# Patient Record
Sex: Female | Born: 1955
Health system: Southern US, Community
[De-identification: ages and names within clinical notes are randomized; demographics above are authoritative.]

## PROBLEM LIST (undated history)

## (undated) DIAGNOSIS — E119 Type 2 diabetes mellitus without complications: Secondary | ICD-10-CM

---

## 1998-05-31 ENCOUNTER — Emergency Department (HOSPITAL_COMMUNITY): Admission: EM | Admit: 1998-05-31 | Discharge: 1998-05-31 | Payer: Self-pay | Admitting: Emergency Medicine

## 1998-07-08 ENCOUNTER — Ambulatory Visit (HOSPITAL_COMMUNITY): Admission: RE | Admit: 1998-07-08 | Discharge: 1998-07-08 | Payer: Self-pay | Admitting: Obstetrics

## 1998-07-08 ENCOUNTER — Other Ambulatory Visit: Admission: RE | Admit: 1998-07-08 | Discharge: 1998-07-08 | Payer: Self-pay | Admitting: Obstetrics

## 1998-07-08 ENCOUNTER — Encounter: Admission: RE | Admit: 1998-07-08 | Discharge: 1998-07-08 | Payer: Self-pay | Admitting: Obstetrics

## 1998-07-11 ENCOUNTER — Ambulatory Visit (HOSPITAL_COMMUNITY): Admission: RE | Admit: 1998-07-11 | Discharge: 1998-07-11 | Payer: Self-pay | Admitting: Obstetrics

## 1998-07-11 ENCOUNTER — Encounter: Payer: Self-pay | Admitting: Obstetrics

## 2002-07-12 ENCOUNTER — Other Ambulatory Visit: Admission: RE | Admit: 2002-07-12 | Discharge: 2002-07-12 | Payer: Self-pay | Admitting: Gynecology

## 2002-11-26 ENCOUNTER — Ambulatory Visit (HOSPITAL_COMMUNITY): Admission: RE | Admit: 2002-11-26 | Discharge: 2002-11-26 | Payer: Self-pay | Admitting: Gynecology

## 2003-09-28 ENCOUNTER — Emergency Department (HOSPITAL_COMMUNITY): Admission: AD | Admit: 2003-09-28 | Discharge: 2003-09-28 | Payer: Self-pay | Admitting: Family Medicine

## 2003-10-01 ENCOUNTER — Emergency Department (HOSPITAL_COMMUNITY): Admission: AD | Admit: 2003-10-01 | Discharge: 2003-10-01 | Payer: Self-pay | Admitting: Family Medicine

## 2003-10-09 ENCOUNTER — Emergency Department (HOSPITAL_COMMUNITY): Admission: AD | Admit: 2003-10-09 | Discharge: 2003-10-09 | Payer: Self-pay | Admitting: Family Medicine

## 2003-12-16 ENCOUNTER — Emergency Department (HOSPITAL_COMMUNITY): Admission: AD | Admit: 2003-12-16 | Discharge: 2003-12-16 | Payer: Self-pay | Admitting: Family Medicine

## 2004-01-29 ENCOUNTER — Emergency Department (HOSPITAL_COMMUNITY): Admission: EM | Admit: 2004-01-29 | Discharge: 2004-01-30 | Payer: Self-pay | Admitting: Emergency Medicine

## 2004-08-12 ENCOUNTER — Ambulatory Visit (HOSPITAL_COMMUNITY): Admission: RE | Admit: 2004-08-12 | Discharge: 2004-08-12 | Payer: Self-pay | Admitting: Obstetrics and Gynecology

## 2004-12-23 ENCOUNTER — Encounter (INDEPENDENT_AMBULATORY_CARE_PROVIDER_SITE_OTHER): Payer: Self-pay | Admitting: Specialist

## 2004-12-23 ENCOUNTER — Observation Stay (HOSPITAL_COMMUNITY): Admission: RE | Admit: 2004-12-23 | Discharge: 2004-12-24 | Payer: Self-pay | Admitting: Obstetrics and Gynecology

## 2007-10-27 ENCOUNTER — Ambulatory Visit: Payer: Self-pay | Admitting: Cardiology

## 2007-11-13 ENCOUNTER — Ambulatory Visit: Payer: Self-pay

## 2007-11-13 ENCOUNTER — Encounter: Payer: Self-pay | Admitting: Cardiology

## 2007-11-14 ENCOUNTER — Encounter: Admission: RE | Admit: 2007-11-14 | Discharge: 2007-11-14 | Payer: Self-pay | Admitting: Family Medicine

## 2007-11-20 ENCOUNTER — Ambulatory Visit: Payer: Self-pay | Admitting: Cardiology

## 2007-11-25 ENCOUNTER — Emergency Department (HOSPITAL_COMMUNITY): Admission: EM | Admit: 2007-11-25 | Discharge: 2007-11-25 | Payer: Self-pay | Admitting: Family Medicine

## 2009-01-29 ENCOUNTER — Encounter: Admission: RE | Admit: 2009-01-29 | Discharge: 2009-01-29 | Payer: Self-pay | Admitting: Orthopedic Surgery

## 2009-12-14 ENCOUNTER — Emergency Department (HOSPITAL_COMMUNITY): Admission: EM | Admit: 2009-12-14 | Discharge: 2009-12-14 | Payer: Self-pay | Admitting: Emergency Medicine

## 2011-02-09 NOTE — Assessment & Plan Note (Signed)
[facility]                            CARDIOLOGY OFFICE NOTE   [name], WILLIARD                      MRN:          [id number]  DATE:10/27/2007                            DOB:          [birth year]    As asked to consult on Jose Persia by Dr. Sharee Holster for some chest  discomfort.   HISTORY OF PRESENT ILLNESS:  She is 55 years of age [place] American  female.  She awoke this morning with some substernal chest discomfort  described as an ache and soreness.  It goes up into the lower part of  the neck.   It is tender to touch.  She saw Dr. Sharee Holster who saw some T-wave  inversion in V1-V3 and gave me a call.  She does have some cardiac risk  factors and we have brought her over for consultation.   Her risk factors include increasing age, sedentary lifestyle, family  history, though her mother who had an MI at age 3, smoked and had  diabetes, a sister her 39s had diabetes.  Ms. Christell Constant has significant  weight problems.  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   She denies any exertional chest tightness, pressure or dyspnea on  exertion.  She had no orthopnea, PND or peripheral edema.  She has a  history of PACs that were evaluate by Dr. Dietrich Pates in 2004 but they  are asymptomatic.   PAST MEDICAL:  Intolerant to SULFA.  She has no dye allergy.  She does  not use recreational products, does not drink alcohol.  Does not smoke.   SURGICAL HISTORY:  She had ovarian tumor removed in 1996.  She had a  tumor removed from her finger 1995.   Her current meds are none.   SOCIAL HISTORY:  As described above.  She has one child.   Review of systems were negative.   She does not know her lipid status.   EXAM:  She is very pleasant lady in no acute distress.  Her blood pressure 124/78, her pulse 70 and regular, weight 182.  She is  5 feet 1/4 inches.  HEENT:  Normocephalic, atraumatic.   PERRL.  Extraocular is intact.  Sclerae are clear.  Face symmetry normal.  She wears glasses.  Dentition  satisfactory.  Neck is supple.  Carotids are equal bilateral bruits, no JVD.  Thyroid  is not enlarged.  Trachea is midline.  Lungs were clear.  HEART:  Reveals a nondisplaced PMI.  She has two areas of fatty  prominence under each breast over the rib cage.  She says she has had  the one left for a long time.  They most consistent with lipomas.  She  has normal S1-S2.  No click.  No gallop.  S2 splits soft.  ABDOMEN:  Good bowel sounds.  She is obese, making organomegaly  difficult to assess.  EXTREMITIES:  No cyanosis clubbing or edema.  There is no sign of DVT.  Pulses are intact.  NEURO:  Exam is intact.   Her EKG shows  sinus rhythm with low voltage.  She has PACs.  She has T-  wave inversion in V1-V2 with some biphasic Ts in V3.  Compared to a  previous ECG here in 2004.  There has been no substantial change.   I had pressed on her chest and reproduced her pain as had Dr. Sharee Holster.   ASSESSMENT:  1. Musculoskeletal chest discomfort.  2. Obesity.  3. Sedentary lifestyle.  4. Unknown lipid status.  5. Family history of coronary disease and diabetes.   I spent 30 minutes plus talking the Ms. Moore about risk factor  modification including walking or exercise 3 hours per week and weight  reduction.  She also has a lipids checked.   I will check a 2-D echo to make sure she has no structural heart  disease.  I will talk to her once these tests are obtained.  I do not  think this is coronary.     Thomas C. Daleen Squibb, MD, [facility]  Electronically Signed    TCW/MedQ  DD: 10/27/2007  DT: 10/27/2007  Job #: [id number]   cc:   Dr. Thomasene Lot, [facility] on [address]

## 2011-02-09 NOTE — Assessment & Plan Note (Signed)
[facility]                            CARDIOLOGY OFFICE NOTE   LEMA, HEINKEL                      MRN:          [id number]  DATE:11/20/2007                            DOB:          1956-03-20    Ms. Christell Constant returns today for further management of her chest discomfort.  We thought it was musculoskeletal which improved with our  recommendations.   We obtained a 2-D echocardiogram because of some risk factors, having a  fairly sedentary lifestyle, and borderline blood pressure.   Her 2-D echocardiogram was completely normal with normal left  ventricular systolic function, normal right-sided structures and  function, normal chamber size and normal valvular function.  She had a  trivial but not clinically significant pericardial effusion  posterior  to the heart.   She says she has never had trouble with hypertension.  Her blood  pressure today is 131/77.   Her 2-D echocardiogram showed some minimal thickening of the wall but  her septum was 8 mm. Her posterior wall was 13 mm. I am not sure of the  significance of this.   She is on a multivitamin.   PHYSICAL EXAMINATION:  Her blood pressure is 131/77, pulse 73 and  regular.  Weight is 183.  HEENT:  Normocephalic, atraumatic.  PERRLA.  Extraocular movements  intact. Sclerae are clear.  Facial symmetry normal.  Carotid upstrokes  are equal bilaterally without bruits, no JVD.  Thyroid is not enlarged.  Trachea is midline.  LUNGS:  Clear.  HEART:  Reveals a regular rate and rhythm.  She has occasional  extrasystole. PMI was poorly appreciated.  ABDOMEN:  Obese with good bowel sounds.  Organomegaly could not be  assessed.  EXTREMITIES:  No edema.  Pulses are intact.  NEUROLOGIC:  Intact.   I have had a long chat with Ms. Moore about watching her blood pressure.  I am not sure she really does have LVH based on the readings.  However,  keeping a good watch on a her blood pressure and regular  physical  examination is recommended.  I have also talked to her about therapeutic  lifestyle choices including walking 3 hours per week.  Weight reduction  is also an important factor as we discussed.   I will seeing her back on a p.r.n. basis.     Thomas C. Daleen Squibb, MD, [facility]  Electronically Signed    TCW/MedQ  DD: 11/20/2007  DT: 11/20/2007  Job #: [id number]   cc:   Thomasene Lot, MD

## 2011-02-12 NOTE — Op Note (Signed)
NAMEMONZERAT, HANDLER               ACCOUNT NO.:  [id number]   MEDICAL RECORD NO.:  [id number]          PATIENT TYPE:  OBV   LOCATION:  9399                          [facility]ILITY:  [facility]   PHYSICIAN:  Maxie Better, M.D.DATE OF BIRTH:  01-Dec-1955   DATE OF PROCEDURE:  12/23/2004  DATE OF DISCHARGE:                                 OPERATIVE REPORT   PREOPERATIVE DIAGNOSIS:  1.  Pelvic pain.  2.  Fibroid uterus.  3.  Dysfunctional uterine bleeding.   PROCEDURE:  1.  Laparoscopic assisted vaginal hysterectomy.  2.  Left salpingo-oophorectomy.  3.  Adhesiolysis.  4.  Resection of endometriotic implant.   POSTOPERATIVE DIAGNOSIS:  1.  Stage 1 pelvic endometriosis.  2.  Fibroid uterus.  3.  Pelvic pain.  4.  Dysfunctional uterine bleeding.  5.  Abdominal wall adhesions.   ANESTHESIA:  General.   SURGEON:  Maxie Better, M.D.   ASSISTANT:  Floyde Parkins, M.D.   PROCEDURE:  Under adequate general anesthesia, the patient was placed in the  dorsal lithotomy position.  She was sterilely prepped and draped in the  usual fashion.  An indwelling Foley catheter was sterilely placed.  Examination under anesthesia revealed an anteverted bulky uterus about 10  weeks size, no adnexal masses could be appreciated.  A bivalve speculum was  placed in the vagina. A single-tooth tenaculum was placed on the anterior  lip of the cervix.  An acorn cannula was introduced in the cervical os and  attached to the tenaculum for manipulation of the uterus.  The bivalve  speculum was then removed, attention was then turned to the abdomen.  Marcaine 0.25% was injected infraumbilically.  An infraumbilical incision  was then made, Veress needle was introduced, tested with the normal saline.  2.5L of CO2  was insufflated. The veress needle was then removed and a 10 mm  disposable trocar with sleeve was introduced into the abdomen without  incident.  A lighted video laparoscope was then introduced  through the port.  On inspection of the abdomen and pelvis, it was noted that there was omental  adhesion to the right anterior abdominal wall and focally on the right  lateral abdominal wall.  The liver edge was noted to be normal.  The pelvis  was noted to have a bulky uterus and some pedunculated fibroids.  Marcaine  0.25% was then injected along the planned lower port incisions mid lower  abdomen laterally.  Under direct visualization, 5 mm ports were then placed.  Using a probe, the pelvis was inspected.  Both tubes showed evidence of  prior tubal ligation.  The right ovary contains a corpus/hemorrhagic cyst  about 1.5 cm, ovary was otherwise normal.  The uteroovarian ligament on the  right was short.  The left tube and ovaries were otherwise normal with the  exception of the surgical separation of the tube on the left.  In the  posterior cul-de-sac there were some Allen-Masters windows on the left  posterior cul-de-sac with a foci of endometriotic implants present just  below where the ureter courses on the left.  This was not  amenable to  cauterization due to its actual location.  The right ureter was easily seen,  peristalsis in deep as well in the pelvis.  The anterior cul-de-sac showed  no endometriosis and no endometriosis was noted in the remaining portion of  the posterior cul-de-sac.  Lateral to the right fallopian tube was very tiny  possible endometriotic implant but that was in the mesosalpinx area.  The  tripolar cauterization was then utilized to cauterize and severe the right  round ligament.  The right uteroovarian ligaments were then serially  clamped, cut and cauterized, freeing the attachment of the right tube and  ovary from the uterine attachment.  On the left, the left infundibulopelvic  ligament was identified.  Again, verifying the ureter was deep in the pelvis  the left round ligament was cauterized, cut and severed from its attachment  to the uterus.  The left  infundibulopelvic ligament was then serially  clamped, cauterized and cut, freeing the left tube and ovary from its  attachment to the pelvic side wall.  Anteriorly the bladder peritoneum was  stented, opened, hydrodissected and then opened transversely using scissors.  The bladder was then carefully displaced inferiorly.  The omental adhesions  to the anterior abdominal wall and right anterior abdominal side walls were  then serially clamped, cauterized and cut until the omental attachment was  removed from the anterior abdominal wall.  The pelvis was then irrigated,  suctioned, small bleeders cauterized.  When the dissection was felt to be  down to the level that is needed for a vaginal procedure, attention was then  turned to the vagina.  The instruments that were in the pelvis to the ports  were removed.  The port instruments remained and then attention was then  turned to the pelvis.  The acorn cannula and single-tooth tenaculum was then  removed, a weighted speculum was then placed.  Jacobsen clamps were then  used to grasp the anterior and posterior lip of the cervix.  The cervix was  then placed on traction.  A diluted solution of Pitressin was  circumferentially injected at the cervicovaginal junction.  A  circumferential incision was then made at the cervicovaginal junction and  dissection was done using Mayo scissors.  The posterior cul-de-sac was then  opened and extended transversely.  The vaginal cuff posteriorly was sutured  with a running lock stitch of 0 Vicryl suture.  The uterosacral ligaments  were then bilaterally clamped, cut and suture ligated with Heaney suture of  0 Vicryl.  Anteriorly, the anterior cul-de-sac was then carefully opened.  Using the ligature, the cardinal ligaments were serially clamped,  cauterized, cut and uterine vessels bilaterally were clamped, cauterized and  cut and this was continued until all attachments of the uterus to the lateral side wall  was accomplished.  At that point, the uterus with the left  tube and ovary was then removed.  Using retraction, careful inspection, the  endometriotic implant that was a focal in the posterior cul-de-sac on the  left was identified, brought up and carefully sharply dissected.  The base  was gently cauterized.  Due to the Allen-Masters windows that are present, a  decision was made not to place Halban's culdoplasty.  The lateral sides had  some bleeding which a figure-of-eight Vicryl suture was then made and placed  with good hemostasis noted.  At that point the decisions were made to close  the vagina.  The vagina was then closed vertically using interrupted 0  Vicryl figure-of-eight sutures.  The uterosacral ligaments were then tied in  the midline prior to closing the vagina completely.  After that, the vagina  was then irrigated, suctioned and attention was turned back to the abdomen.  Sterile technique was then maintained, abdomen was reinsufflated, the  lighted video laparoscope was introduced, the [place] irrigation system was  then utilized to irrigate the pelvis.  Small bleeders and anterior cul-de-  sac was cauterized.  The tube and ovary on the right was inspected, good  hemostasis was noted.  Several attempts at finding the appendix was not  successful.  There was no evidence of the appendix in the pelvis as well and  with copious irrigation, suction, good hemostasis, with the abdomen  partially deflated for additional look for any bleeders, she had good  hemostasis again, decision was then made to complete the procedure.  The  lower portion was then removed under direct visualization, the abdomen was  deflated, the infraumbilical port was then removed, taking care not to free  up any underlying structures.  The fascia was then identified using S  retractors, 0 Vicryl, figure-of-eights were then placed.  The infraumbilical  incision was closed with 4-0 Vicryl subcuticular stitch  and the lower ports  were closed with Dermabond.   SPECIMEN:  Uterus and cervix with left tube and ovary, the resected  endometriotic implant.   ESTIMATED BLOOD LOSS:  About 100 mL.   URINE OUTPUT:  200 mL clear yellow urine.   INTRAOPERATIVE FLUID:  1600 mL crystalloid.   SPONGE AND INSTRUMENT COUNTS:  Correct x2.   COMPLICATIONS:  None.   The patient tolerated the procedure well, was transferred to the recovery  room in stable condition.      Hardin/MEDQ  D:  12/23/2004  T:  12/23/2004  Job:  [id number]

## 2011-02-12 NOTE — H&P (Signed)
[name], [name]               ACCOUNT NO.:  [id number]   MEDICAL RECORD NO.:  [id number]          PATIENT TYPE:  OBV   LOCATION:  NA                            [facility]   PHYSICIAN:  Maxie Better, M.D.DATE OF BIRTH:  10-27-1955   DATE OF ADMISSION:  12/23/2004  DATE OF DISCHARGE:                                HISTORY & PHYSICAL   CHIEF COMPLAINT:  Dysfunctional uterine bleeding, pelvic pain.   HISTORY OF PRESENT ILLNESS:  A 55 year old, gravida 1, para 1, married,  black female history of tubal ligation, last menstrual period of December 08, 2004, who is now being admitted for laparoscopic assisted vaginal  hysterectomy, left salpingo-oophorectomy secondary to dysfunctional uterine  bleeding and pelvic pain.  The patient repots cycles every 21 days, lasting  four days with associated large clots.  She also complains of post coital  bleeding, pre cycle cramping, and severe menstrual cramps, pain with  intercourse that has been ongoing since the onset of her symptoms.  Her  evaluation included an endometrial biopsy, done on August 27, 2004, that  showed late proliferative/early secretory endometrium.  Pap smear that was  normal on November 2005.  Gonorrhea and Chlamydia cultures that were  negative.  Open MRI of her lumbar spine which showed no compressive canal or  foraminal stenosis.  Colposcopy for her post coital bleeding was benign.  Ultrasound, on July 23, 2004, showed a uterus that was 9.8 x 5.8 x 7.7,  normal ovaries bilaterally, multiple small fibroids with a hypoechoic mass  thought to be within the endometrium.  Sonohysterogram was performed, no  masses seen, August 27, 2004.  With the extensive evaluation and ongoing  symptoms, the patient now presents for surgical management.  The pain has  not been fully relieved by nonsteroidals.  Ultram was given without much  relief of her symptoms.  The patient also notes some low back pain with the  pain running  down her left leg.  No associated left numbness and due to the  pain, the patient does not have participate in intercourse.  She complains  of the pain on a daily basis requiring four Tylenol daily.   PAST MEDICAL HISTORY:   ALLERGIES:  SULFA.   MEDICINES:  Tylenol.   MEDICAL HISTORY:  Negative.   SURGICAL HISTORY:  Tubal ligation.   OBSTETRICAL HISTORY:  Vaginal delivery.   FAMILY HISTORY:  Three maternal uncles with colon cancer.  Mother died of  CHF, diabetes.  No ovarian or breast cancer.  Hypertension is in the family.   SOCIAL HISTORY:  Married x 3 years.  One child.  Nonsmoker.  Self employed.   REVIEW OF SYSTEMS:  As per HPI otherwise systems negative.   PHYSICAL EXAMINATION:  GENERAL:  A well-developed, well-nourished female, no  acute distress.  VITAL SIGNS:  Blood pressure 120/82, weight 179 pounds.  SKIN:  Shows no lesions.  HEENT:  Anicteric sclerae.  Pink conjunctivae.  Oropharynx negative.  HEART:  Regular rate and rhythm without murmur.  LUNGS:  Clear to auscultation.  BREASTS:  Soft, nontender.  No palpable mass.  NODES:  No supraclavicular, axillary, or inguinal nodes palpable.  NECK:  Supple.  Thyroid not palpable.  BACK:  No CVA tenderness.  ABDOMEN:  Soft.  Palpable mass about three finger breadths above the  symphysis pubis.  No hepatomegaly or splenomegaly is noted.  PELVIC:  Vulva showed no lesions.  Vagina has no discharge.  Cervix was  closed.  Uterus about 12 weeks' size, irregular.  Adnexa no palpable mass  though limited by the uterine size.  RECTAL:  Deferred.   IMPRESSION:  1.  Pelvic pain.  2.  Dysfunction uterine bleeding.  3.  Uterine fibroids.   PLAN:  1.  Admission.  2.  Laparoscopic assisted vaginal hysterectomy.  3.  Left salpingo-oophorectomy.  4.  Antibiotic prophylaxis.  5.  Antiembolic stockings.  6.  Routine admission labs.   The risk of the procedure was explained to the patient including but not  limited to the  inability to complete the procedure laparoscopically or  vaginal infection, bleeding, possibly for blood transfusion, risk of blood  transfusion was reviewed including HIV transmission, hepatitis transmission,  and acute reaction, injury to surrounding organ structures such as the  bladder, bowel, ureter, possibility of loss of the right ovary due to  apparent disease at the time of the surgery was discussed, fistula  formation, possibly need for surgery in the future due to the retention of  the right ovary such as persistent cyst and ovarian cancer.  Post-op care  and criteria for discharge were reviewed.  The patient desires removal of  the left ovary and tube because of predominant site of pain is on the left.  All questions answered.      [place]/MEDQ  D:  12/22/2004  T:  12/22/2004  Job:  [id number]

## 2011-06-23 ENCOUNTER — Other Ambulatory Visit (HOSPITAL_COMMUNITY): Payer: Self-pay | Admitting: Obstetrics and Gynecology

## 2011-06-23 ENCOUNTER — Ambulatory Visit (HOSPITAL_COMMUNITY)
Admission: RE | Admit: 2011-06-23 | Discharge: 2011-06-23 | Disposition: A | Discharge status: Home / Self Care | Payer: 59 | Source: Ambulatory Visit | Attending: Obstetrics and Gynecology | Admitting: Obstetrics and Gynecology

## 2011-06-23 DIAGNOSIS — Z1231 Encounter for screening mammogram for malignant neoplasm of breast: Secondary | ICD-10-CM | POA: Insufficient documentation

## 2011-07-17 ENCOUNTER — Emergency Department (HOSPITAL_COMMUNITY): Payer: No Typology Code available for payment source | Attending: Emergency Medicine

## 2011-07-17 ENCOUNTER — Emergency Department (HOSPITAL_COMMUNITY)
Admission: EM | Admit: 2011-07-17 | Discharge: 2011-07-17 | Disposition: A | Discharge status: Home / Self Care | Payer: No Typology Code available for payment source | Source: Home / Self Care | Attending: Emergency Medicine | Admitting: Emergency Medicine

## 2011-07-17 DIAGNOSIS — S0003XA Contusion of scalp, initial encounter: Secondary | ICD-10-CM | POA: Insufficient documentation

## 2011-07-17 DIAGNOSIS — M542 Cervicalgia: Secondary | ICD-10-CM | POA: Insufficient documentation

## 2011-07-17 DIAGNOSIS — R51 Headache: Secondary | ICD-10-CM | POA: Insufficient documentation

## 2012-06-23 ENCOUNTER — Other Ambulatory Visit: Payer: Self-pay | Admitting: Family Medicine

## 2012-06-23 DIAGNOSIS — R911 Solitary pulmonary nodule: Secondary | ICD-10-CM

## 2012-06-28 ENCOUNTER — Ambulatory Visit
Admission: RE | Admit: 2012-06-28 | Discharge: 2012-06-28 | Disposition: A | Discharge status: Home / Self Care | Payer: 59 | Source: Ambulatory Visit | Attending: Family Medicine | Admitting: Family Medicine

## 2012-06-28 DIAGNOSIS — R911 Solitary pulmonary nodule: Secondary | ICD-10-CM

## 2012-06-28 MED ORDER — IOHEXOL 300 MG/ML  SOLN
75.0000 mL | Freq: Once | INTRAMUSCULAR | Status: AC | PRN
  Administered 2012-06-28: 75 mL via INTRAVENOUS

## 2015-02-13 ENCOUNTER — Emergency Department (HOSPITAL_COMMUNITY)
Admission: EM | Admit: 2015-02-13 | Discharge: 2015-02-13 | Disposition: A | Discharge status: Home / Self Care | Payer: Self-pay | Attending: Emergency Medicine | Admitting: Emergency Medicine

## 2015-02-13 ENCOUNTER — Encounter (HOSPITAL_COMMUNITY): Payer: Self-pay | Admitting: Physical Medicine and Rehabilitation

## 2015-02-13 DIAGNOSIS — T50995A Adverse effect of other drugs, medicaments and biological substances, initial encounter: Secondary | ICD-10-CM | POA: Insufficient documentation

## 2015-02-13 DIAGNOSIS — L270 Generalized skin eruption due to drugs and medicaments taken internally: Secondary | ICD-10-CM | POA: Insufficient documentation

## 2015-02-13 DIAGNOSIS — Z889 Allergy status to unspecified drugs, medicaments and biological substances status: Secondary | ICD-10-CM

## 2015-02-13 MED ORDER — TRIAMCINOLONE ACETONIDE 0.025 % EX OINT: 1.0000 "application " | TOPICAL_OINTMENT | Freq: Two times a day (BID) | CUTANEOUS | Status: AC

## 2015-02-13 MED ORDER — DEXAMETHASONE SODIUM PHOSPHATE 10 MG/ML IJ SOLN
10.0000 mg | Freq: Once | INTRAMUSCULAR | Status: AC
  Administered 2015-02-13: 10 mg via INTRAMUSCULAR
  Filled 2015-02-13: qty 1

## 2015-02-13 MED ORDER — PREDNISONE 20 MG PO TABS: ORAL_TABLET | ORAL | Status: DC

## 2015-02-13 NOTE — Discharge Instructions (Signed)
Drug Allergy °Allergic reactions to medicines are common. Some allergic reactions are mild. A delayed type of drug allergy that occurs 1 week or more after exposure to a medicine or vaccine is called serum sickness. A life-threatening, sudden (acute) allergic reaction that involves the whole body is called anaphylaxis. °CAUSES  °"True" drug allergies occur when there is an allergic reaction to a medicine. This is caused by overactivity of the immune system. First, the body becomes sensitized. The immune system is triggered by your first exposure to the medicine. Following this first exposure, future exposure to the same medicine may be life-threatening. °Almost any medicine can cause an allergic reaction. Common ones are: °· Penicillin. °· Sulfonamides (sulfa drugs). °· Local anesthetics. °· X-ray dyes that contain iodine. °SYMPTOMS  °Common symptoms of a minor allergic reaction are: °· Swelling around the mouth. °· An itchy red rash or hives. °· Vomiting or diarrhea. °Anaphylaxis can cause swelling of the mouth and throat. This makes it difficult to breathe and swallow. Severe reactions can be fatal within seconds, even after exposure to only a trace amount of the drug that causes the reaction. °HOME CARE INSTRUCTIONS  °· If you are unsure of what caused your reaction, keep a diary of foods and medicines used. Include the symptoms that followed. Avoid anything that causes reactions. °· You may want to follow up with an allergy specialist after the reaction has cleared in order to be tested to confirm the allergy. It is important to confirm that your reaction is an allergy, not just a side effect to the medicine. If you have a true allergy to a medicine, this may prevent that medicine and related medicines from being given to you when you are very ill. °· If you have hives or a rash: °¨ Take medicines as directed by your caregiver. °¨ You may use an over-the-counter antihistamine (diphenhydramine) as  needed. °¨ Apply cold compresses to the skin or take baths in cool water. Avoid hot baths or showers. °· If you are severely allergic: °¨ Continuous observation after a severe reaction may be needed. Hospitalization is often required. °¨ Wear a medical alert bracelet or necklace stating your allergy. °¨ You and your family must learn how to use an anaphylaxis kit or give an epinephrine injection to temporarily treat an emergency allergic reaction. If you have had a severe reaction, always carry your epinephrine injection or anaphylaxis kit with you. This can be lifesaving if you have a severe reaction. °· Do not drive or perform tasks after treatment until the medicines used to treat your reaction have worn off, or until your caregiver says it is okay. °SEEK MEDICAL CARE IF:  °· You think you had an allergic reaction. Symptoms usually start within 30 minutes after exposure. °· Symptoms are getting worse rather than better. °· You develop new symptoms. °· The symptoms that brought you to your caregiver return. °SEEK IMMEDIATE MEDICAL CARE IF:  °· You have swelling of the mouth, difficulty breathing, or wheezing. °· You have a tight feeling in your chest or throat. °· You develop hives, swelling, or itching all over your body. °· You develop severe vomiting or diarrhea. °· You feel faint or pass out. °This is an emergency. Use your epinephrine injection or anaphylaxis kit as you have been instructed. Call for emergency medical help. Even if you improve after the injection, you need to be examined at a hospital emergency department. °MAKE SURE YOU:  °· Understand these instructions. °· Will watch   your condition.  Will get help right away if you are not doing well or get worse. Document Released: 09/13/2005 Document Revised: 12/06/2011 Document Reviewed: 02/17/2011 [facility] Patient Information 2015 [place], [place]. This information is not intended to replace advice given to you by your health care provider. Make  sure you discuss any questions you have with your health care provider.

## 2015-02-13 NOTE — ED Notes (Signed)
Pt reports she recently starting taking diet supplements and drinking diet tea. Now reports swelling to face and both eyes. Ongoing x2 days. Respirations unlabored. Pt is alert and oriented x4.

## 2015-02-13 NOTE — ED Provider Notes (Signed)
CSN: [id number]     Arrival date & time 02/13/15  1154 History  This chart was scribed for non-physician practitioner working, Arthor CaptainAbigail Rayhana Slider, PA-C, with Benjiman CoreNathan Pickering, MD, by Modena JanskyAlbert Thayil, ED Scribe. This patient was seen in room TR11C/TR11C and the patient's care was started at 3:12 PM.   Chief Complaint  Patient presents with  . Allergic Reaction   The history is provided by the patient. No language interpreter was used.   HPI Comments: [name] is a 59 y.o. female who presents to the Emergency Department complaining of an allergic reaction that started 2 days ago. She states that she started having facial swelling and periorbital swelling after she started taking Garcinia Cambogia 2 days ago. She reports no recent use of new personal care products. She denies dizziness, tongue swelling, change in voice, wheezing, or SOB  History reviewed. No pertinent past medical history. History reviewed. No pertinent past surgical history. No family history on file. History  Substance Use Topics  . Smoking status: Never Smoker   . Smokeless tobacco: Not on file  . Alcohol Use: No   OB History    No data available     Review of Systems  HENT: Positive for facial swelling.   Respiratory: Negative for shortness of breath and wheezing.   Neurological: Negative for dizziness and speech difficulty.   A complete 10 system review of systems was obtained and all systems are negative except as noted in the HPI and PMH.   Allergies  Benadryl and Sulfa antibiotics  Home Medications   Prior to Admission medications   Not on File   BP 121/54 mmHg  Pulse 67  Temp(Src) 98.4 F (36.9 C) (Oral)  Resp 18  SpO2 100% Physical Exam  Constitutional: She is oriented to person, place, and time. She appears well-developed and well-nourished. No distress.  HENT:  Head: Normocephalic and atraumatic.  Mouth/Throat: Oropharynx is clear and moist. No oropharyngeal exudate.  Significant periorbital  edema.  No changes in phonation. Swallowing easily.   Neck: Neck supple. No tracheal deviation present.  Cardiovascular: Normal rate and regular rhythm.  Exam reveals no gallop and no friction rub.   No murmur heard. Pulmonary/Chest: Effort normal. No stridor. No respiratory distress. She has no wheezes.  No stridor.   Musculoskeletal: Normal range of motion.  Neurological: She is alert and oriented to person, place, and time.  Skin: Skin is warm and dry.  Significant facial swelling and erythema. Maculopapular eruptions to face, upper chest and arms, neck and scalp with areas of excoriation.  Psychiatric: She has a normal mood and affect. Her behavior is normal.  Nursing note and vitals reviewed.   ED Course  Procedures (including critical care time) DIAGNOSTIC STUDIES: Oxygen Saturation is 100% on RA, normal by my interpretation.    COORDINATION OF CARE: 3:16 PM- Pt advised of plan for treatment and pt agrees.  Labs Review Labs Reviewed - No data to display  Imaging Review No results found.   EKG Interpretation None      MDM   Final diagnoses:  Drug allergy   Filed Vitals:   02/13/15 1217 02/13/15 1417 02/13/15 1538  BP: 141/73 121/54 114/51  Pulse: 61 67 71  Temp: 98.2 F (36.8 C) 98.4 F (36.9 C)   TempSrc: Oral Oral   Resp: 18 18 18   SpO2: 99% 100% 100%    Patient with drug eruption. Given Decadron here. No airway involvement or signs of anaphylaxis. Discharged with Atarax, triamcinolone, she  is to discontinue the offending agent. She appears safe for discharge at this time. I personally performed the services described in this documentation, which was scribed in my presence. The recorded information has been reviewed and is accurate.      Arthor Captainbigail Suheyb Raucci, PA-C 02/25/15 2032  Benjiman CoreNathan Pickering, MD 02/26/15 Marlyne Beards0002

## 2015-02-13 NOTE — ED Notes (Signed)
PA at the bedside.

## 2018-02-01 ENCOUNTER — Other Ambulatory Visit: Payer: Self-pay | Admitting: Family Medicine

## 2018-02-01 DIAGNOSIS — Z1231 Encounter for screening mammogram for malignant neoplasm of breast: Secondary | ICD-10-CM

## 2018-02-22 ENCOUNTER — Ambulatory Visit
Admission: RE | Admit: 2018-02-22 | Discharge: 2018-02-22 | Disposition: A | Discharge status: Home / Self Care | Payer: BLUE CROSS/BLUE SHIELD | Source: Ambulatory Visit | Attending: Family Medicine | Admitting: Family Medicine

## 2018-02-22 DIAGNOSIS — Z1231 Encounter for screening mammogram for malignant neoplasm of breast: Secondary | ICD-10-CM

## 2021-12-25 ENCOUNTER — Encounter (HOSPITAL_COMMUNITY): Payer: Self-pay

## 2021-12-25 ENCOUNTER — Ambulatory Visit (HOSPITAL_COMMUNITY)
Admission: EM | Admit: 2021-12-25 | Discharge: 2021-12-25 | Disposition: A | Discharge status: Home / Self Care | Payer: Managed Care, Other (non HMO)

## 2021-12-25 ENCOUNTER — Ambulatory Visit (INDEPENDENT_AMBULATORY_CARE_PROVIDER_SITE_OTHER): Payer: Managed Care, Other (non HMO)

## 2021-12-25 DIAGNOSIS — W19XXXA Unspecified fall, initial encounter: Secondary | ICD-10-CM

## 2021-12-25 DIAGNOSIS — M25562 Pain in left knee: Secondary | ICD-10-CM | POA: Diagnosis not present

## 2021-12-25 DIAGNOSIS — S63501A Unspecified sprain of right wrist, initial encounter: Secondary | ICD-10-CM

## 2021-12-25 DIAGNOSIS — M25531 Pain in right wrist: Secondary | ICD-10-CM | POA: Diagnosis not present

## 2021-12-25 NOTE — ED Triage Notes (Signed)
Today, while walking through the parking lot Pt fell forward landed on her left knee and bilateral hands. Right hand pain > left. Confirms swelling and bruising on her left knee and right wrist. No meds taken. Notes some numbness in right wrist. Ambulating with a limp due to pain. ?

## 2021-12-25 NOTE — Discharge Instructions (Addendum)
Take Motrin or Tylenol as needed for pain.  Your x-ray showed no evidence of acute fracture or dislocation.  Wear Ace wrap and follow RICE therapy with rest ice elevation compression.  Follow-up in 7 to 10 days if pain persists or is worse for rex-ray as we could not exclude an occult fracture. ?

## 2021-12-25 NOTE — ED Provider Notes (Signed)
?[facility] ? ? ?MRN: [id number] DOB: 11/08/1955 ? ?Subjective:  ? ?Chief Complaint;  ?Chief Complaint  ?Patient presents with  ? Wrist Pain  ?  right  ? Knee Pain  ?  left  ? ? ?Tori Hosick is a 66 y.o. female presenting for right wrist pain and left knee pain status post fall today.  She denies head neck or back injury.  She is able to bear weight on her left knee without significant pain.  She denies taking any medications prior to arrival ? ?No current facility-administered medications for this encounter. ? ?Current Outpatient Medications:  ?  metFORMIN (GLUCOPHAGE) 500 MG tablet, Take 2 tablets (500 mg total) two times daily with meals., Disp: , Rfl:  ?  predniSONE (DELTASONE) 20 MG tablet, 3 tabs po daily x 3 days, then 2 tabs x 3 days, then 1.5 tabs x 3 days, then 1 tab x 3 days, then 0.5 tabs x 3 days, Disp: 27 tablet, Rfl: 0 ?  Semaglutide, 2 MG/DOSE, (OZEMPIC, 2 MG/DOSE,) 8 MG/3ML SOPN, See admin instructions., Disp: , Rfl:  ?  triamcinolone (KENALOG) 0.025 % ointment, Apply 1 application topically 2 (two) times daily. Do not apply to the face, Disp: 30 g, Rfl: 0  ? ?Allergies  ?Allergen Reactions  ? Benadryl [Diphenhydramine Hcl]   ? Sulfa Antibiotics   ? ? ?History reviewed. No pertinent past medical history.  ? ?Review of Systems  ?All other systems reviewed and are negative. ? ? ?Objective:  ? ?Vitals: ?BP 134/82 (BP Location: Left Arm)   Pulse 89   Temp 98.7 ?F (37.1 ?C) (Oral)   Resp 17   SpO2 98%  ? ?Physical Exam ?Vitals and nursing note reviewed.  ?Constitutional:   ?   General: She is not in acute distress. ?   Appearance: Normal appearance. She is well-developed.  ?HENT:  ?   Head: Normocephalic and atraumatic.  ?Eyes:  ?   Conjunctiva/sclera: Conjunctivae normal.  ?Cardiovascular:  ?   Rate and Rhythm: Normal rate and regular rhythm.  ?   Heart sounds: No murmur heard. ?Pulmonary:  ?   Effort: Pulmonary effort is normal. No respiratory distress.  ?   Breath sounds:  Normal breath sounds.  ?Abdominal:  ?   Palpations: Abdomen is soft.  ?   Tenderness: There is no abdominal tenderness.  ?Musculoskeletal:     ?   General: Swelling and tenderness present. No deformity. Normal range of motion.  ?   Cervical back: Neck supple.  ?   Comments: + bruising ventral aspect of right wrist without deformity or gross swelling. No nvd- FROM- snuff box NT- + ttp diffuse volar aspect right ? ?+ superficial bruising to anterior knee- no bruising or gross swelling/defomity- FROM no nvdd ambulatory without pain neg drawer and mcmurrays  ?Skin: ?   General: Skin is warm and dry.  ?   Capillary Refill: Capillary refill takes less than 2 seconds.  ?Neurological:  ?   Mental Status: She is alert.  ?Psychiatric:     ?   Mood and Affect: Mood normal.  ? ? ?No results found for this or any previous visit (from the past 24 hour(s)). ? ?DG Wrist Complete Right ? ?Result Date: 12/25/2021 ?CLINICAL DATA:  Fall onto right wrist today. EXAM: RIGHT WRIST - COMPLETE 3+ VIEW COMPARISON:  None. FINDINGS: There is no evidence of fracture or dislocation. There is no evidence of arthropathy or other focal bone abnormality. Soft tissues  are unremarkable. IMPRESSION: Negative. Electronically Signed   By: Ileana Roup M.D.   On: 12/25/2021 16:33    ?  ? ?Assessment and Plan :  ? ?1. Sprain of right wrist, initial encounter   ?2. Acute pain of left knee   ?3. Fall, initial encounter   ? ? ?No orders of the defined types were placed in this encounter. ? ? ?MDM:  ?Carlyssa Castiglioni is a 66 y.o. female presenting for s/p mechanical fall onto her right wrist and left knee.  There are no gross deformities patient has full range of motion some bruising to the volar aspect of the right wrist.  There are minor abrasions to the left knee.  There is no evidence of fracture of the right wrist however patient is cautioned on potential of occult fracture in the wrist and will return in 7 to 10 days if not improving sooner if worse.  She  is encouraged to take Motrin and Tylenol as needed for pain.  I discussed treatment, follow up and return instructions. Questions were answered. Patient stated understanding of instructions and is stable for discharge. ? ?Leida Lauth FNP-C MCN  ?  ?Hezzie Bump, NP ?12/25/21 1653 ? ?

## 2022-05-06 ENCOUNTER — Other Ambulatory Visit: Payer: Self-pay | Admitting: Family Medicine

## 2022-05-06 DIAGNOSIS — Z1231 Encounter for screening mammogram for malignant neoplasm of breast: Secondary | ICD-10-CM

## 2022-05-26 ENCOUNTER — Ambulatory Visit
Admission: RE | Admit: 2022-05-26 | Discharge: 2022-05-26 | Disposition: A | Discharge status: Home / Self Care | Payer: Commercial Managed Care - HMO | Source: Ambulatory Visit | Attending: Family Medicine | Admitting: Family Medicine

## 2022-05-26 DIAGNOSIS — Z1231 Encounter for screening mammogram for malignant neoplasm of breast: Secondary | ICD-10-CM

## 2022-05-28 ENCOUNTER — Other Ambulatory Visit: Payer: Self-pay | Admitting: Family Medicine

## 2022-05-28 DIAGNOSIS — R928 Other abnormal and inconclusive findings on diagnostic imaging of breast: Secondary | ICD-10-CM

## 2022-05-31 ENCOUNTER — Ambulatory Visit
Admission: EM | Admit: 2022-05-31 | Discharge: 2022-05-31 | Disposition: A | Discharge status: Home / Self Care | Payer: Commercial Managed Care - HMO | Attending: Urgent Care | Admitting: Urgent Care

## 2022-05-31 DIAGNOSIS — K122 Cellulitis and abscess of mouth: Secondary | ICD-10-CM

## 2022-05-31 DIAGNOSIS — K0510 Chronic gingivitis, plaque induced: Secondary | ICD-10-CM | POA: Diagnosis not present

## 2022-05-31 HISTORY — DX: Type 2 diabetes mellitus without complications: E11.9

## 2022-05-31 MED ORDER — CHLORHEXIDINE GLUCONATE 0.12 % MT SOLN: OROMUCOSAL | 0 refills | Status: AC

## 2022-05-31 MED ORDER — AMOXICILLIN-POT CLAVULANATE 875-125 MG PO TABS: 1.0000 | ORAL_TABLET | Freq: Two times a day (BID) | ORAL | 0 refills | Status: DC

## 2022-05-31 NOTE — ED Triage Notes (Signed)
Pt c/o lesion appearing to inner frontal mouth area behind upper lip. Appeared ~ 2 weeks ago. Reports needing dental work but unable to get an apt in near future.

## 2022-05-31 NOTE — ED Provider Notes (Signed)
  [facility]  Note:  This document was prepared using Dragon voice recognition software and may include unintentional dictation errors.  MRN: [id number] DOB: 08/05/1956  Subjective:   [name] is a 66 y.o. female presenting for 2-week history of persistent and worsening pain over the gums and front teeth.  Patient feels like it is draining.  She has a dental specialist that she has been working with to get them capped.  She does not have a follow-up appointment until the end of this month.  No current facility-administered medications for this encounter.  Current Outpatient Medications:    metFORMIN (GLUCOPHAGE) 500 MG tablet, Take 2 tablets (500 mg total) two times daily with meals., Disp: , Rfl:    predniSONE (DELTASONE) 20 MG tablet, 3 tabs po daily x 3 days, then 2 tabs x 3 days, then 1.5 tabs x 3 days, then 1 tab x 3 days, then 0.5 tabs x 3 days, Disp: 27 tablet, Rfl: 0   Semaglutide, 2 MG/DOSE, (OZEMPIC, 2 MG/DOSE,) 8 MG/3ML SOPN, See admin instructions., Disp: , Rfl:    triamcinolone (KENALOG) 0.025 % ointment, Apply 1 application topically 2 (two) times daily. Do not apply to the face, Disp: 30 g, Rfl: 0   Allergies  Allergen Reactions   Benadryl [Diphenhydramine Hcl]    Sulfa Antibiotics     Past Medical History:  Diagnosis Date   Diabetes mellitus without complication (HCC)      History reviewed. No pertinent surgical history.  History reviewed. No pertinent family history.  Social History   Tobacco Use   Smoking status: Never  Substance Use Topics   Alcohol use: No   Drug use: No    ROS   Objective:   Vitals: BP 129/80 (BP Location: Left Arm)   Pulse 90   Temp 98 F (36.7 C) (Oral)   Resp 16   SpO2 97%   Physical Exam Constitutional:      General: She is not in acute distress.    Appearance: Normal appearance. She is well-developed. She is not ill-appearing, toxic-appearing or diaphoretic.  HENT:     Head: Normocephalic and  atraumatic.     Nose: Nose normal.     Mouth/Throat:     Mouth: Mucous membranes are moist.   Eyes:     General: No scleral icterus.       Right eye: No discharge.        Left eye: No discharge.     Extraocular Movements: Extraocular movements intact.  Cardiovascular:     Rate and Rhythm: Normal rate.  Pulmonary:     Effort: Pulmonary effort is normal.  Skin:    General: Skin is warm and dry.  Neurological:     General: No focal deficit present.     Mental Status: She is alert and oriented to person, place, and time.  Psychiatric:        Mood and Affect: Mood normal.        Behavior: Behavior normal.     Assessment and Plan :   PDMP not reviewed this encounter.  1. Oral infection   2. Gingivitis    Patient has gingivitis and an oral infection and recommended Augmentin, chlorhexidine rinse.  Follow-up closely with the oral surgeon. Counseled patient on potential for adverse effects with medications prescribed/recommended today, ER and return-to-clinic precautions discussed, patient verbalized understanding.    Wallis Bamberg, [place] 05/31/22 Rickey Primus

## 2022-06-11 ENCOUNTER — Ambulatory Visit
Admission: RE | Admit: 2022-06-11 | Discharge: 2022-06-11 | Disposition: A | Discharge status: Home / Self Care | Payer: Commercial Managed Care - HMO | Source: Ambulatory Visit | Attending: Family Medicine | Admitting: Family Medicine

## 2022-06-11 ENCOUNTER — Other Ambulatory Visit: Payer: Self-pay | Admitting: Family Medicine

## 2022-06-11 DIAGNOSIS — N6310 Unspecified lump in the right breast, unspecified quadrant: Secondary | ICD-10-CM

## 2022-06-11 DIAGNOSIS — R928 Other abnormal and inconclusive findings on diagnostic imaging of breast: Secondary | ICD-10-CM

## 2022-06-15 ENCOUNTER — Ambulatory Visit
Admission: RE | Admit: 2022-06-15 | Discharge: 2022-06-15 | Disposition: A | Discharge status: Home / Self Care | Payer: Commercial Managed Care - HMO | Source: Ambulatory Visit | Attending: Family Medicine | Admitting: Family Medicine

## 2022-06-15 DIAGNOSIS — N6310 Unspecified lump in the right breast, unspecified quadrant: Secondary | ICD-10-CM

## 2022-10-05 DIAGNOSIS — E119 Type 2 diabetes mellitus without complications: Secondary | ICD-10-CM | POA: Insufficient documentation

## 2023-06-01 ENCOUNTER — Ambulatory Visit
Admission: EM | Admit: 2023-06-01 | Discharge: 2023-06-01 | Disposition: A | Discharge status: Home / Self Care | Payer: Medicare Other

## 2023-06-01 DIAGNOSIS — E119 Type 2 diabetes mellitus without complications: Secondary | ICD-10-CM

## 2023-06-01 DIAGNOSIS — R202 Paresthesia of skin: Secondary | ICD-10-CM | POA: Diagnosis not present

## 2023-06-01 MED ORDER — PREDNISONE 20 MG PO TABS: 40.0000 mg | ORAL_TABLET | Freq: Every day | ORAL | 0 refills | Status: AC

## 2023-06-01 NOTE — ED Triage Notes (Signed)
"  The left side of my face is getting numb and I have been rubbing it". "If often feel's like it is stinging or something is their and I rub it often creating irritation". No fever. No other symptoms of concern.

## 2023-06-01 NOTE — ED Provider Notes (Addendum)
[facility]    CSN: [id number] Arrival date & time: 06/01/23  1000      History   Chief Complaint Chief Complaint  Patient presents with   Skin Problem    HPI [name] is a 67 y.o. female.   Patient here today for evaluation of numbness and tingling to the left side of her face this been present for a while.  She states that she has had minimal symptoms to the right.  She notes that she does rub her face often due to sensation of tingling.  She denies any fever, congestion, nausea, vomiting.  The history is provided by the patient.    Past Medical History:  Diagnosis Date   Diabetes mellitus without complication [facility])     Patient Active Problem List   Diagnosis Date Noted   Type 2 diabetes mellitus, without long-term current use of insulin (HCC) 10/05/2022    History reviewed. No pertinent surgical history.  OB History   No obstetric history on file.      Home Medications    Prior to Admission medications   Medication Sig Start Date End Date Taking? Authorizing Provider  atorvastatin (LIPITOR) 20 MG tablet Take 20 mg by mouth daily. 10/23/18  Yes [provider]  predniSONE (DELTASONE) 20 MG tablet Take 2 tablets (40 mg total) by mouth daily with breakfast for 5 days. 06/01/23 06/06/23 Yes Tomi Bamberger, PA-C  Semaglutide, 2 MG/DOSE, (OZEMPIC, 2 MG/DOSE,) 8 MG/3ML SOPN See admin instructions.   Yes [provider]  chlorhexidine (PERIDEX) 0.12 % solution Rinse mouth with 15mL for 30 seconds twice daily. 05/31/22   Wallis Bamberg, PA-C  cholecalciferol (VITAMIN D3) 25 MCG (1000 UNIT) tablet Take 1,000 Units by mouth daily.    [provider]  Cyanocobalamin 1000 MCG TBCR 1 tablet Orally Once a day    [provider]  metFORMIN (GLUCOPHAGE) 500 MG tablet Take 2 tablets (500 mg total) two times daily with meals. 10/23/18   [provider]  triamcinolone (KENALOG) 0.025 % ointment Apply 1 application topically 2  (two) times daily. Do not apply to the face 02/13/15   Arthor Captain, PA-C    Family History History reviewed. No pertinent family history.  Social History Social History   Tobacco Use   Smoking status: Never   Smokeless tobacco: Never  Vaping Use   Vaping status: Never Used  Substance Use Topics   Alcohol use: No   Drug use: No     Allergies   Sulfa antibiotics, Diphenhydramine, Benadryl [diphenhydramine hcl], and Diphenhydramine hcl   Review of Systems Review of Systems  Constitutional:  Negative for chills and fever.  Eyes:  Negative for discharge and redness.  Gastrointestinal:  Negative for abdominal pain, nausea and vomiting.  Skin:  Negative for color change and rash.     Physical Exam Triage Vital Signs ED Triage Vitals  Encounter Vitals Group     BP 06/01/23 1013 137/77     Systolic BP Percentile --      Diastolic BP Percentile --      Pulse Rate 06/01/23 1013 74     Resp 06/01/23 1013 18     Temp 06/01/23 1013 98 F (36.7 C)     Temp Source 06/01/23 1013 Oral     SpO2 06/01/23 1013 96 %     Weight 06/01/23 1011 164 lb (74.4 kg)     Height 06/01/23 1011 5\' 2"  (1.575 m)     Head  Circumference --      Peak Flow --      Pain Score 06/01/23 1003 0     Pain Loc --      Pain Education --      Exclude from Growth Chart --    No data found.  Updated Vital Signs BP 137/77 (BP Location: Left Arm)   Pulse 74   Temp 98 F (36.7 C) (Oral)   Resp 18   Ht 5\' 2"  (1.575 m)   Wt 164 lb (74.4 kg)   SpO2 96%   BMI 30.00 kg/m      Physical Exam Vitals and nursing note reviewed.  Constitutional:      General: She is not in acute distress.    Appearance: Normal appearance. She is not ill-appearing.  HENT:     Head: Normocephalic and atraumatic.     Nose: Nose normal. No congestion or rhinorrhea.     Mouth/Throat:     Mouth: Mucous membranes are moist.     Pharynx: Oropharynx is clear. No oropharyngeal exudate or posterior oropharyngeal erythema.   Eyes:     Extraocular Movements: Extraocular movements intact.     Conjunctiva/sclera: Conjunctivae normal.     Pupils: Pupils are equal, round, and reactive to light.  Cardiovascular:     Rate and Rhythm: Normal rate.  Pulmonary:     Effort: Pulmonary effort is normal. No respiratory distress.  Skin:    Comments: No rash, erythema or swelling appreciated to her face.  Neurological:     Mental Status: She is alert.  Psychiatric:        Mood and Affect: Mood normal.        Behavior: Behavior normal.        Thought Content: Thought content normal.      UC Treatments / Results  Labs (all labs ordered are listed, but only abnormal results are displayed) Labs Reviewed - No data to display  EKG   Radiology No results found.  Procedures Procedures (including critical care time)  Medications Ordered in UC Medications - No data to display  Initial Impression / Assessment and Plan / UC Course  I have reviewed the triage vital signs and the nursing notes.  Pertinent labs & imaging results that were available during my care of the patient were reviewed by me and considered in my medical decision making (see chart for details).    Unclear etiology of symptoms, will treat with steroid burst to hopefully decrease any inflammation.  Discussed that steroids may elevate blood sugar.  Last A1c revealed stable well-controlled diabetes.  Discussed possibility of shingles without rash versus other neuropathic discomfort.  Recommended follow-up with PCP in 1 week or ED sooner with any worsening symptoms.  Patient expressed understanding.  Final Clinical Impressions(s) / UC Diagnoses   Final diagnoses:  Facial tingling sensation  Type 2 diabetes mellitus without complication, without long-term current use of insulin [facility])   Discharge Instructions   None    ED Prescriptions     Medication Sig Dispense Auth. Provider   predniSONE (DELTASONE) 20 MG tablet Take 2 tablets (40 mg total)  by mouth daily with breakfast for 5 days. 10 tablet Tomi Bamberger, PA-C      PDMP not reviewed this encounter.   Tomi Bamberger, PA-C 06/01/23 1213    Tomi Bamberger, PA-C 06/01/23 1214

## 2023-11-06 IMAGING — DX DG WRIST COMPLETE 3+V*R*
3 series · 3 of 3 positions shown · non-contrast
Comparison: None.

CLINICAL DATA: Fall onto right wrist today.

EXAM:
RIGHT WRIST - COMPLETE 3+ VIEW

[wrist pa]
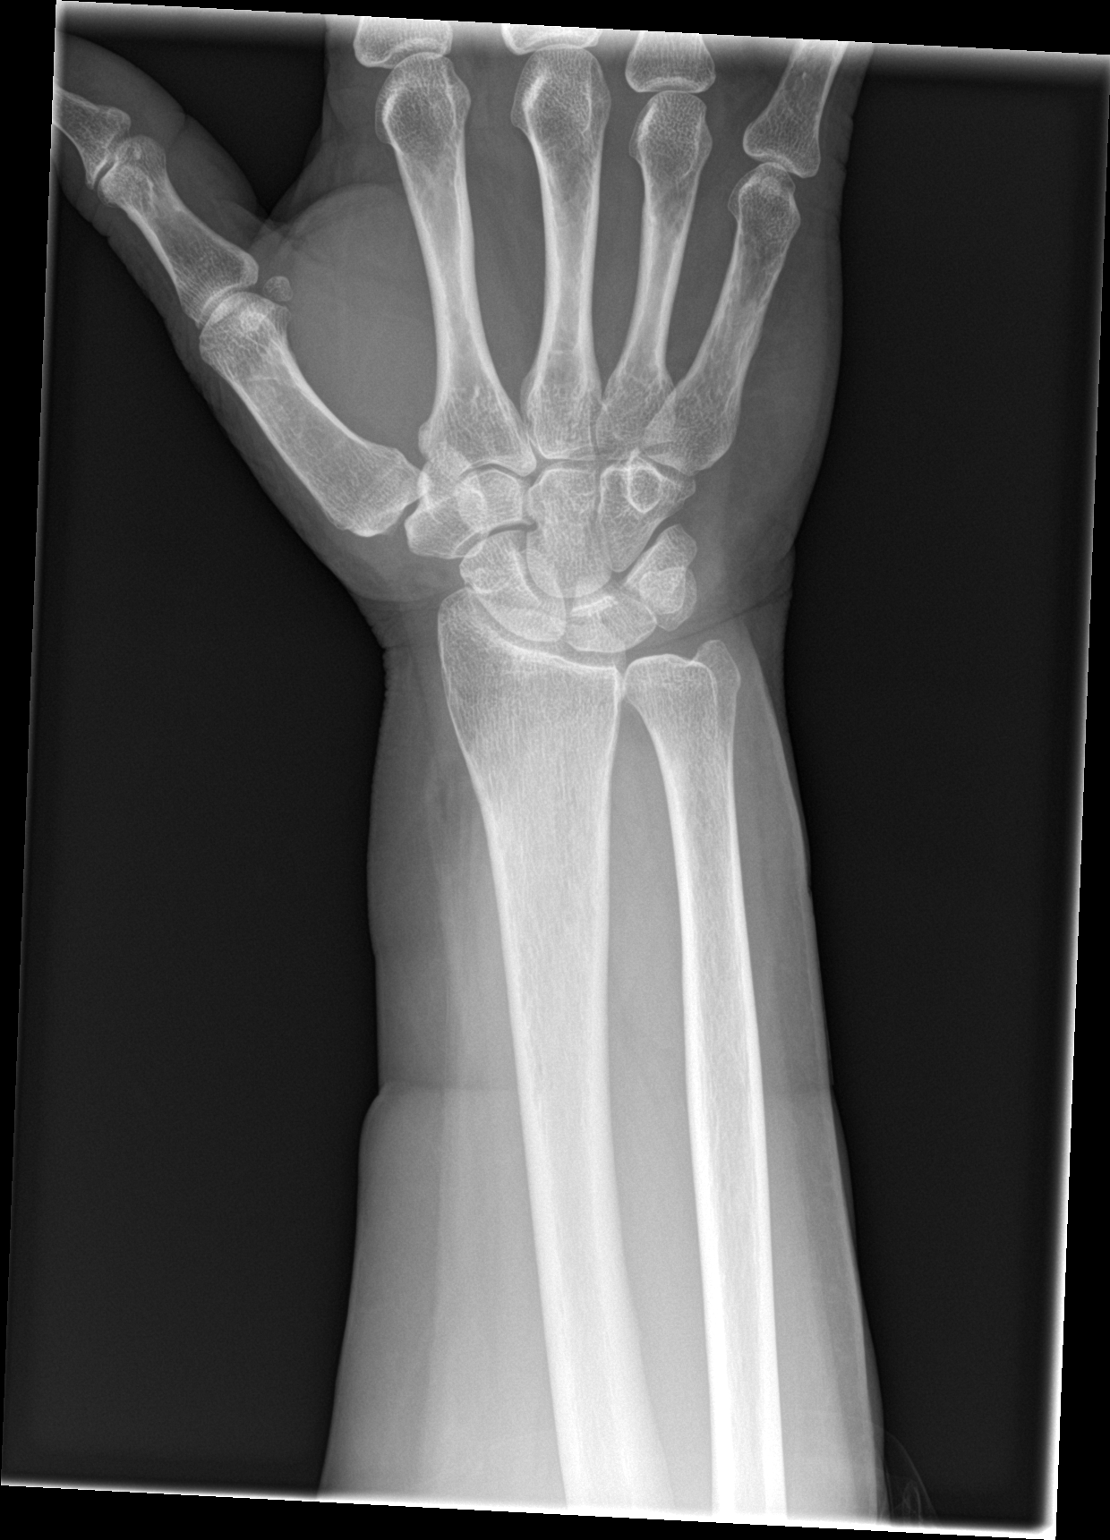

[wrist obl]
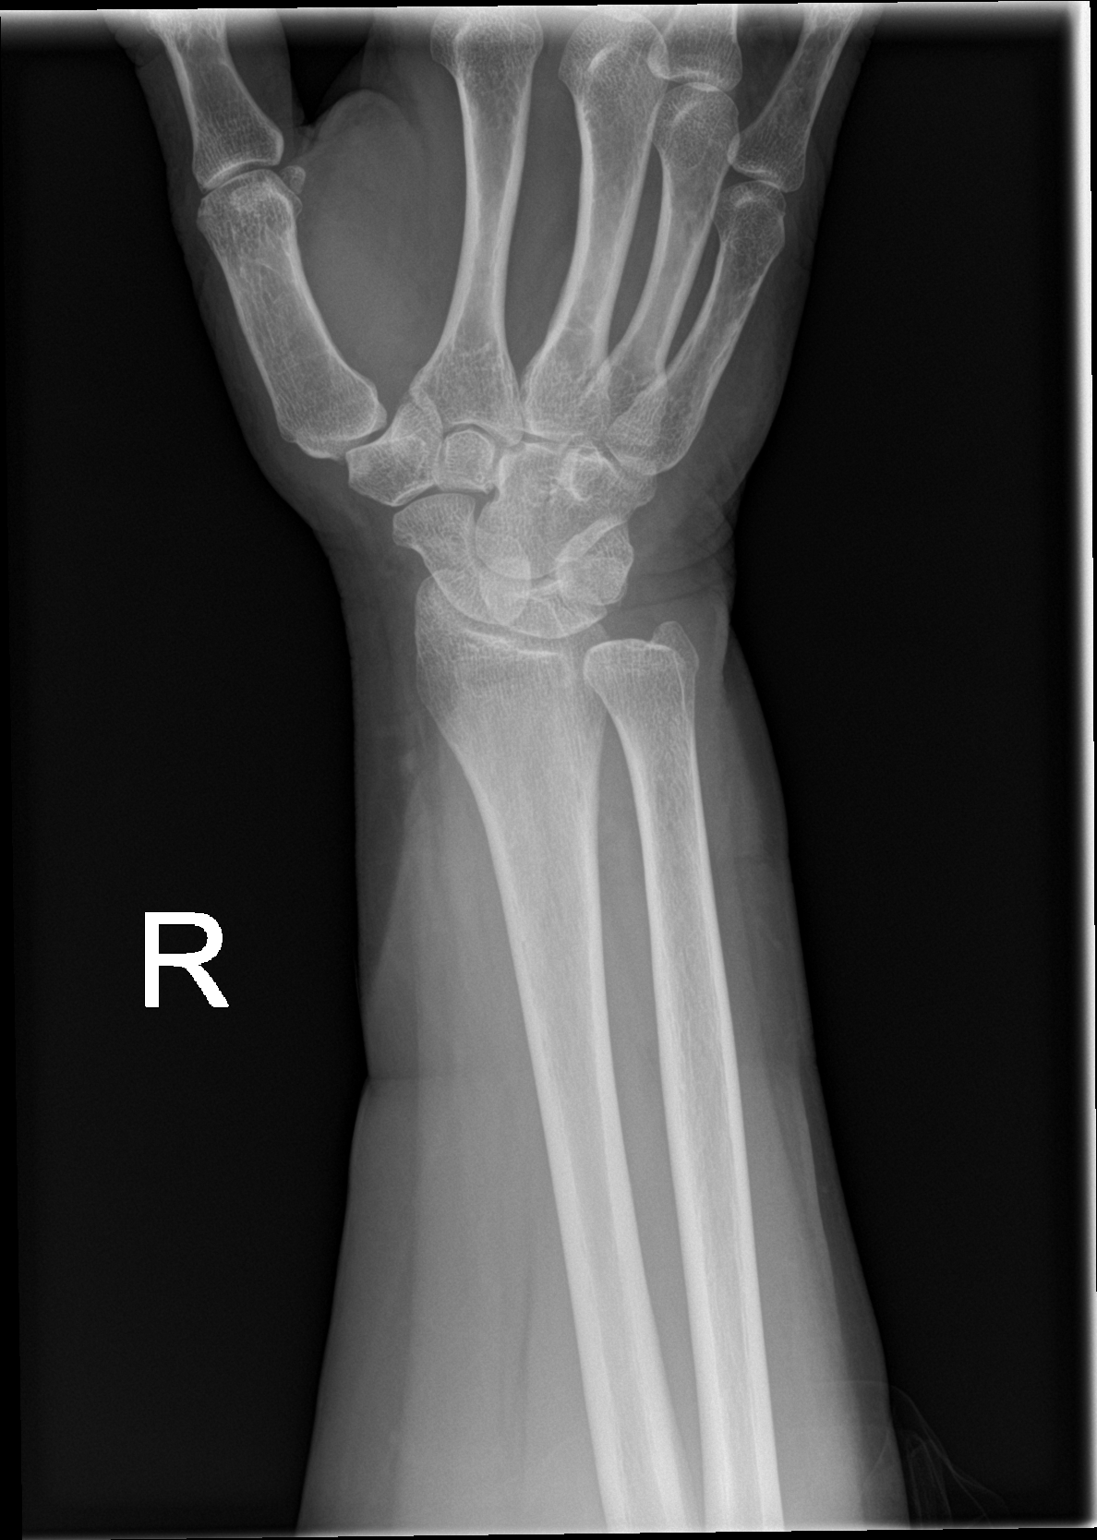

[wrist lat]
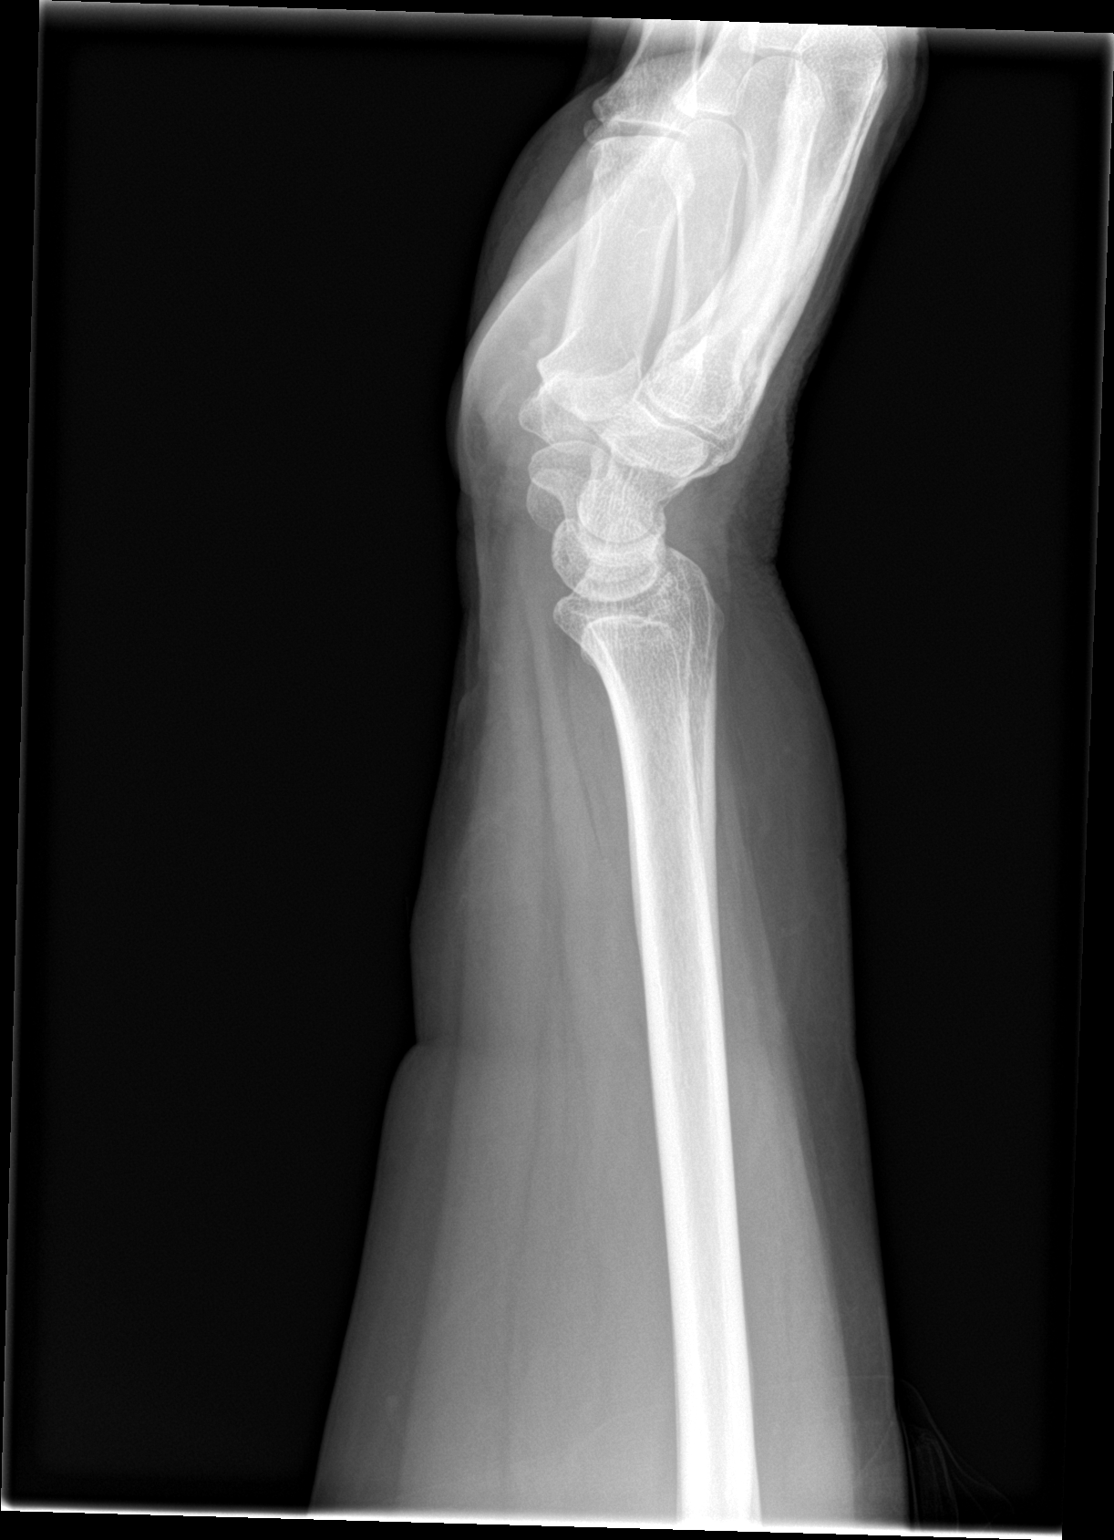

[3 of 3 positions shown; findings below may reference images not displayed]

FINDINGS: There is no evidence of fracture or dislocation. There is no
evidence of arthropathy or other focal bone abnormality. Soft
tissues are unremarkable.
IMPRESSION: Negative.
